# Patient Record
Sex: Female | Born: 1970 | Race: White | Hispanic: No | Smoking: Current every day smoker
Health system: Southern US, Community
[De-identification: ages and names within clinical notes are randomized; demographics above are authoritative.]

---

## 2010-05-29 ENCOUNTER — Encounter: Admission: RE | Admit: 2010-05-29 | Discharge: 2010-05-29 | Payer: Self-pay | Admitting: Obstetrics and Gynecology

## 2010-09-20 ENCOUNTER — Encounter: Payer: Self-pay | Admitting: Obstetrics and Gynecology

## 2011-03-24 ENCOUNTER — Other Ambulatory Visit (HOSPITAL_COMMUNITY): Payer: Self-pay | Admitting: Obstetrics & Gynecology

## 2011-04-14 ENCOUNTER — Other Ambulatory Visit (HOSPITAL_COMMUNITY): Payer: Self-pay | Admitting: Obstetrics & Gynecology

## 2011-04-14 ENCOUNTER — Other Ambulatory Visit: Payer: Self-pay | Admitting: Obstetrics and Gynecology

## 2011-04-16 ENCOUNTER — Other Ambulatory Visit: Payer: Self-pay | Admitting: Obstetrics and Gynecology

## 2011-04-19 ENCOUNTER — Other Ambulatory Visit: Payer: Self-pay | Admitting: Obstetrics and Gynecology

## 2011-08-26 ENCOUNTER — Other Ambulatory Visit: Payer: Self-pay | Admitting: Obstetrics and Gynecology

## 2016-04-05 ENCOUNTER — Other Ambulatory Visit: Payer: Self-pay | Admitting: Obstetrics and Gynecology

## 2016-04-05 DIAGNOSIS — R928 Other abnormal and inconclusive findings on diagnostic imaging of breast: Secondary | ICD-10-CM

## 2016-04-07 ENCOUNTER — Other Ambulatory Visit: Payer: Self-pay

## 2016-04-07 ENCOUNTER — Ambulatory Visit
Admission: RE | Admit: 2016-04-07 | Discharge: 2016-04-07 | Disposition: A | Discharge status: Home / Self Care | Payer: Managed Care, Other (non HMO) | Source: Ambulatory Visit | Attending: Obstetrics and Gynecology | Admitting: Obstetrics and Gynecology

## 2016-04-07 DIAGNOSIS — R928 Other abnormal and inconclusive findings on diagnostic imaging of breast: Secondary | ICD-10-CM

## 2016-12-16 IMAGING — MG 2D DIGITAL DIAGNOSTIC UNILATERAL LEFT MAMMOGRAM WITH CAD AND ADJ
6 series · 6 of 14 positions shown · non-contrast
Comparison: Previous exam(s).

CLINICAL DATA: Recall from screening mammography.

EXAM:
2D DIGITAL DIAGNOSTIC UNILATERAL LEFT MAMMOGRAM WITH CAD AND ADJUNCT
TOMO

[L MLO synth-2D]
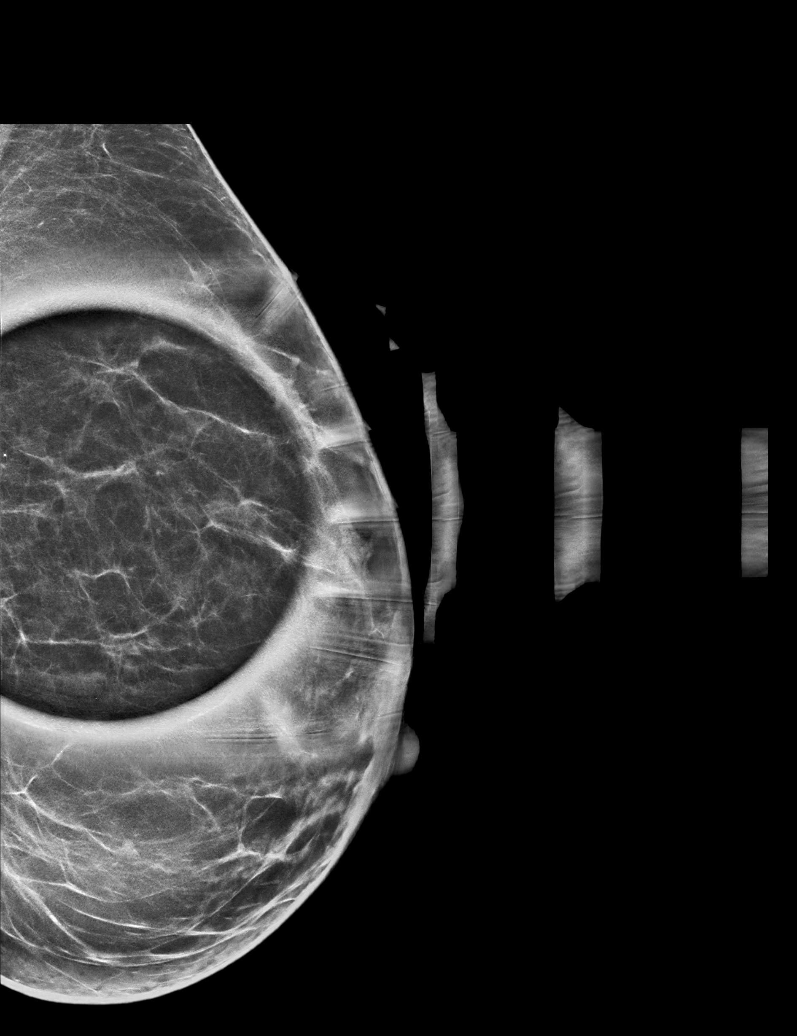

[L MLO]
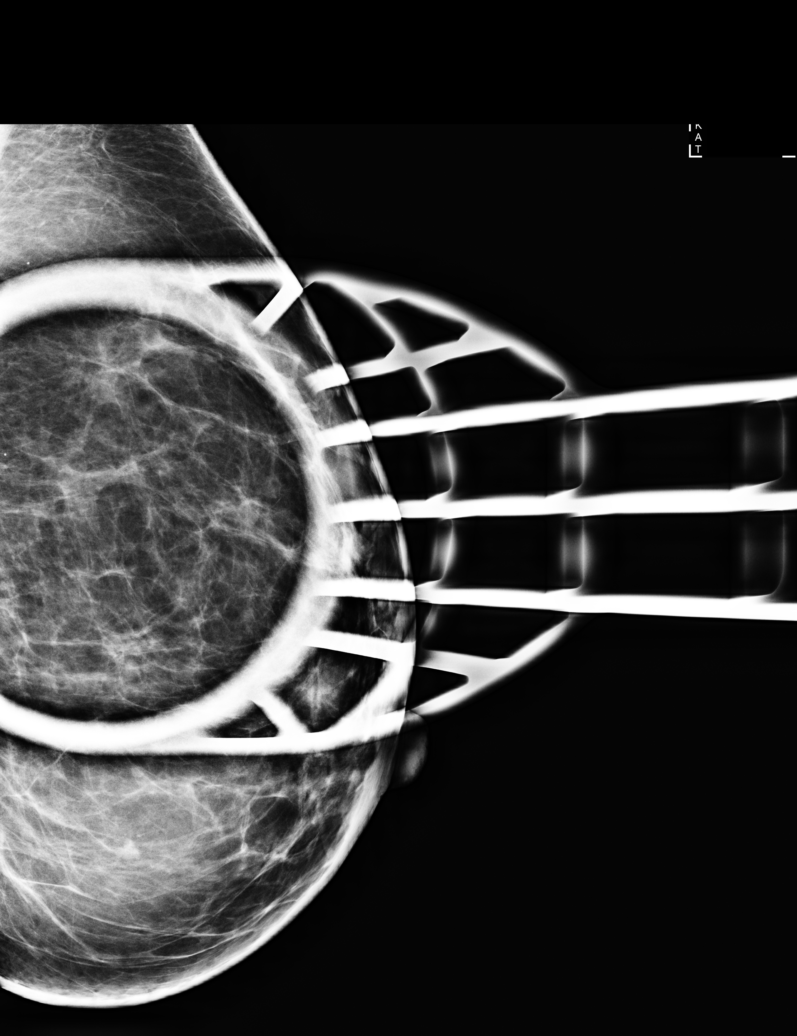

[L ML]
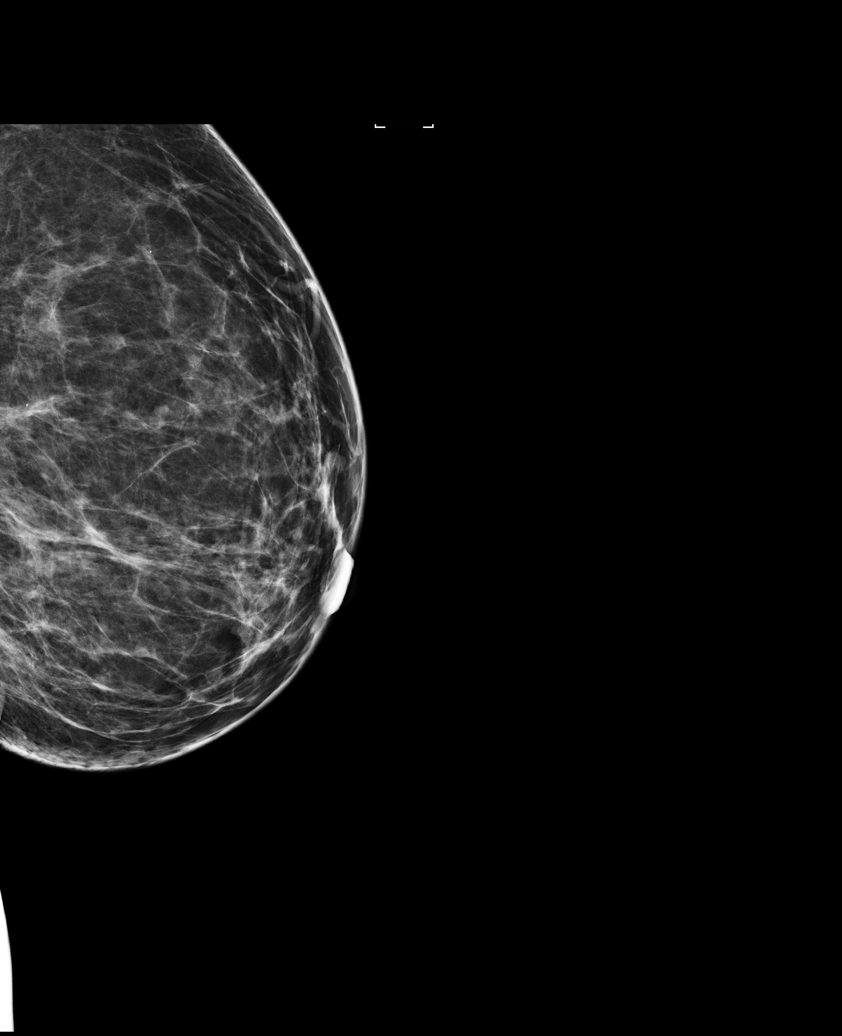

[L ML synth-2D]
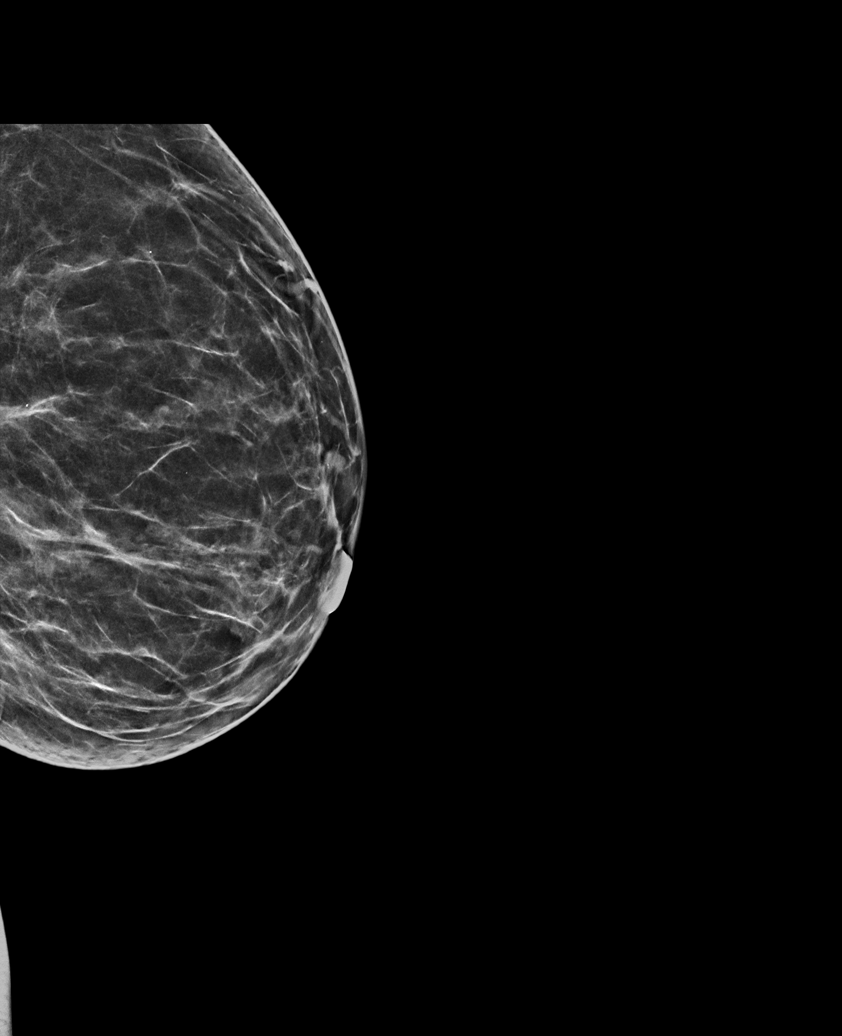

[L MLO tomo · tomo slice 33/64.0]
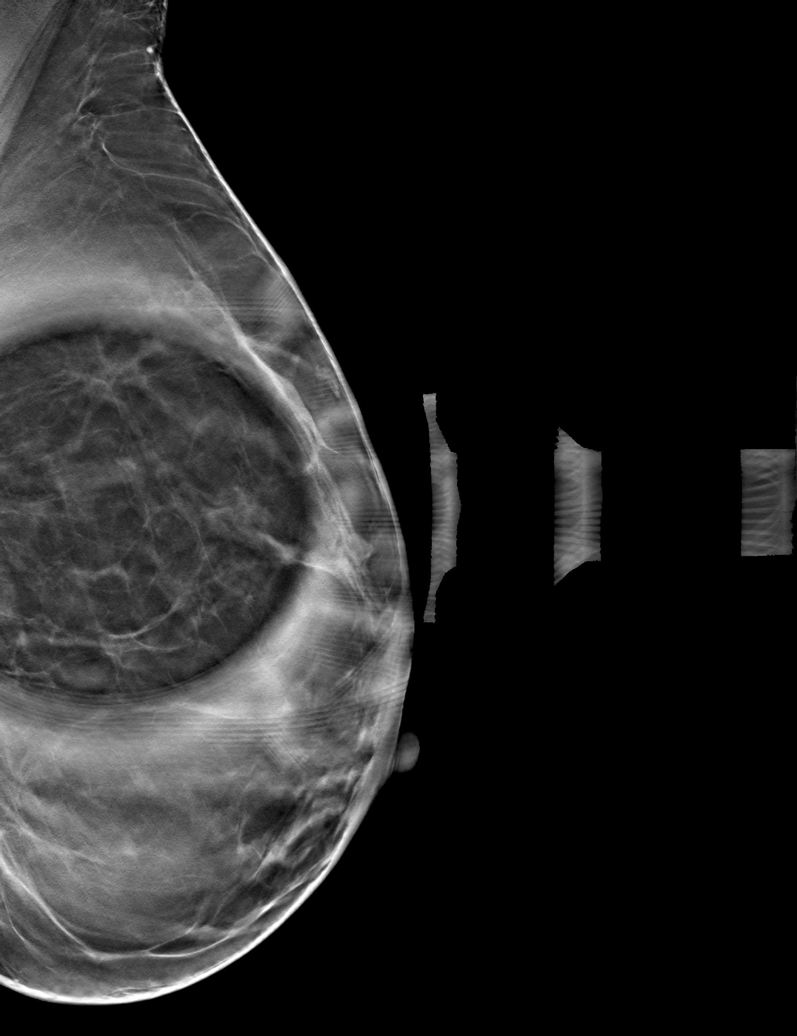

[L ML tomo · tomo slice 34/67.0]
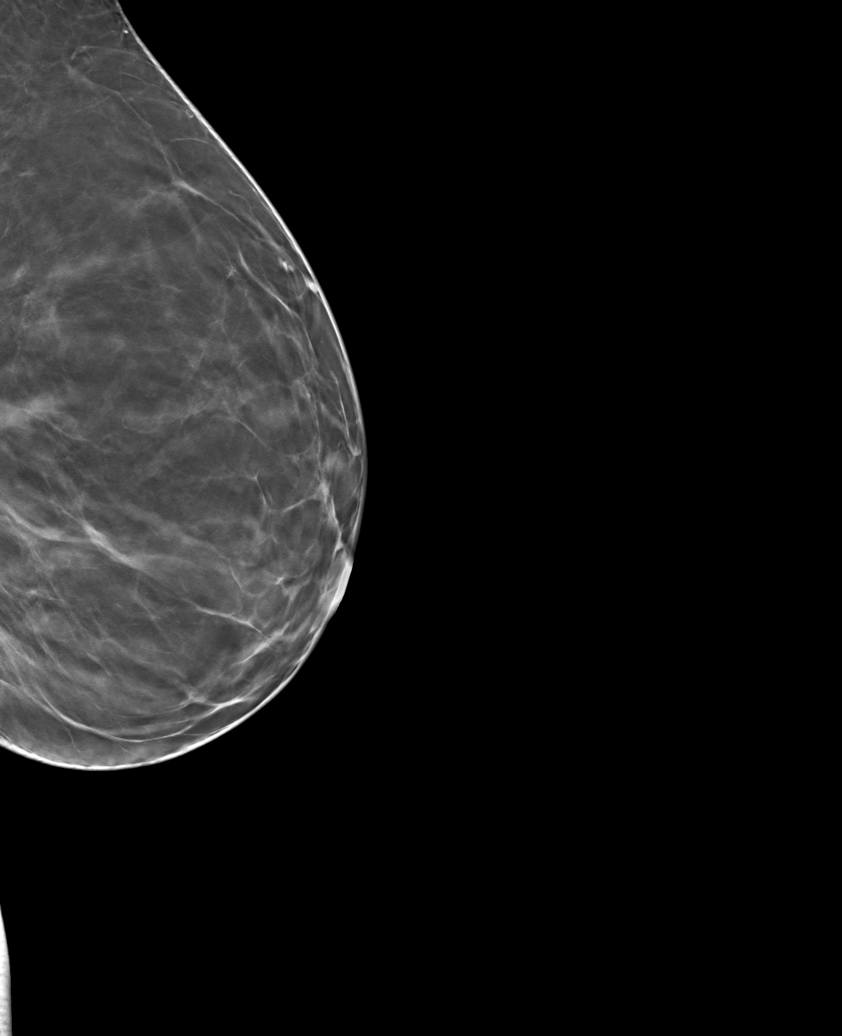

[6 of 14 positions shown; findings below may reference images not displayed]

ACR Breast Density Category b: There are scattered areas of
fibroglandular density.
FINDINGS: Additional views of the left breast with tomosynthesis demonstrate
no persistent abnormality. The appearance noted on the screening
study is consistent with a summation shadow.

Mammographic images were processed with CAD.
IMPRESSION: No persistent worrisome finding on additional evaluation the left
breast.

RECOMMENDATION:
Screening mammography in 1 year.

I have discussed the findings and recommendations with the patient.
Results were also provided in writing at the conclusion of the
visit. If applicable, a reminder letter will be sent to the patient
regarding the next appointment.

BI-RADS CATEGORY  1: Negative.

## 2019-07-11 ENCOUNTER — Ambulatory Visit (INDEPENDENT_AMBULATORY_CARE_PROVIDER_SITE_OTHER): Payer: BC Managed Care – PPO | Admitting: Podiatry

## 2019-07-11 ENCOUNTER — Encounter: Payer: Self-pay | Admitting: Podiatry

## 2019-07-11 ENCOUNTER — Other Ambulatory Visit: Payer: Self-pay

## 2019-07-11 ENCOUNTER — Ambulatory Visit (INDEPENDENT_AMBULATORY_CARE_PROVIDER_SITE_OTHER): Payer: BC Managed Care – PPO

## 2019-07-11 VITALS — BP 102/70 | HR 72 | Resp 16

## 2019-07-11 DIAGNOSIS — M7661 Achilles tendinitis, right leg: Secondary | ICD-10-CM | POA: Diagnosis not present

## 2019-07-11 DIAGNOSIS — M722 Plantar fascial fibromatosis: Secondary | ICD-10-CM

## 2019-07-11 DIAGNOSIS — M2042 Other hammer toe(s) (acquired), left foot: Secondary | ICD-10-CM

## 2019-07-11 NOTE — Progress Notes (Signed)
Subjective:   Patient ID: Vickie Espinoza, female   DOB: 48 y.o.   MRN: [id number]   HPI Patient presents stating I am having a lot of pain in the back of my right heel and on my left foot I have this chronic lesion between the fourth and fifth toes that gets sore.  Patient states that for the right she is tried shoe gear modifications and she uses medication and padding for the left which only helped temporarily.  Patient smokes periodically and likes to be active   Review of Systems  All other systems reviewed and are negative.       Objective:  Physical Exam Vitals signs and nursing note reviewed.  Constitutional:      Appearance: She is well-developed.  Pulmonary:     Effort: Pulmonary effort is normal.  Musculoskeletal: Normal range of motion.  Skin:    General: Skin is warm.  Neurological:     Mental Status: She is alert.     Neurovascular status intact muscle strength found to be adequate range of motion within normal limits.  Patient is noted to have chronic keratotic lesion distal medial aspect digit 5 left and fourth digit with pressure between the 2 toes and is found to have pain in the posterior aspect of the right heel lateral side with inflammation noted.  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  Patient wants injection understanding risk and I did a careful lateral injection keep it away from the center and medial aspect of the tendon with 3 mg dexamethasone 5 mg Xylocaine and for the left I debrided the lesion applied padding and discussed arthroplasty exostectomy and patient wants to get this done we will  check schedule and will schedule surgery and be seen by me prior to the procedure  X-rays indicate there is a small posterior spur right heel and there is an abutment of the fifth toe against the fourth toe left with exostotic lesion noted

## 2019-07-11 NOTE — Patient Instructions (Signed)

## 2019-10-24 ENCOUNTER — Telehealth: Payer: Self-pay | Admitting: *Deleted

## 2019-10-24 NOTE — Telephone Encounter (Signed)
Left message informing pt she was last seen 07/11/2019, and if Dr. Charlsie Merles felt an injection would be appropriate she would be able to receive.

## 2019-10-24 NOTE — Telephone Encounter (Signed)
Pt states she was seen in-office, x-rayed, and received an injection in the right ankle, and wanted to know if she would be able to get another injection.

## 2019-11-02 ENCOUNTER — Ambulatory Visit (INDEPENDENT_AMBULATORY_CARE_PROVIDER_SITE_OTHER): Payer: BC Managed Care – PPO | Admitting: Podiatry

## 2019-11-02 ENCOUNTER — Encounter: Payer: Self-pay | Admitting: Podiatry

## 2019-11-02 ENCOUNTER — Other Ambulatory Visit: Payer: Self-pay

## 2019-11-02 DIAGNOSIS — M722 Plantar fascial fibromatosis: Secondary | ICD-10-CM | POA: Diagnosis not present

## 2019-11-02 DIAGNOSIS — M7661 Achilles tendinitis, right leg: Secondary | ICD-10-CM | POA: Diagnosis not present

## 2019-11-02 NOTE — Progress Notes (Signed)
Subjective:   Patient ID: Vickie Espinoza, female   DOB: 49 y.o.   MRN: [id number]   HPI Patient states she has some discomfort in the posterior heel right but seems to have quite a bit of discomfort in the plantar heel right and is wondering which of the 2 problems seems to be the big problem that she is experiencing and states she is getting ready to go to [place] to hike and to be outdoors   ROS      Objective:  Physical Exam  Neurovascular status intact with plantar fascial right inflamed at the insertion to the calcaneus and posterior pain of a mild to moderate nature     Assessment:  Acute plantar fasciitis right with inflammation and posterior Achilles tendinitis present but improved     Plan:  H&P reviewed both conditions separately and for the posterior heel I recommended stretching exercises ice and anti-inflammatories and for plantar I did sterile prep and injected the fascia 3 mg Kenalog 5 mg Xylocaine and advised on supportive shoes.  Reappoint for [place] to recheck

## 2020-01-11 ENCOUNTER — Ambulatory Visit (INDEPENDENT_AMBULATORY_CARE_PROVIDER_SITE_OTHER): Payer: BC Managed Care – PPO | Admitting: Podiatry

## 2020-01-11 ENCOUNTER — Other Ambulatory Visit: Payer: Self-pay

## 2020-01-11 ENCOUNTER — Encounter: Payer: Self-pay | Admitting: Podiatry

## 2020-01-11 DIAGNOSIS — M7661 Achilles tendinitis, right leg: Secondary | ICD-10-CM

## 2020-01-11 DIAGNOSIS — M722 Plantar fascial fibromatosis: Secondary | ICD-10-CM

## 2020-01-11 MED ORDER — DICLOFENAC SODIUM 75 MG PO TBEC: 75.0000 mg | DELAYED_RELEASE_TABLET | Freq: Two times a day (BID) | ORAL | 2 refills | Status: AC

## 2020-01-14 NOTE — Progress Notes (Signed)
Subjective:   Patient ID: Vickie Espinoza, female   DOB: 49 y.o.   MRN: [id number]   HPI Patient presents stating that the bottom seems to be doing well but the back of the heel has flared back up again and is making walking difficult.  Patient states that she is not having any trouble as far as strength goes but discomfort with shoe gear   ROS      Objective:  Physical Exam  Neurovascular status intact with patient's posterior lateral heel being inflamed and sore with no increased fluid accumulation just discomfort.  Center and medial side not involved currently and also mild to moderate discomfort in the plantar heel into the mid arch and lateral foot     Assessment:  Inflammatory process with inflammation pain of the posterior lateral aspect of the heel and the plantar fashion into the mid arch     Plan:  H&P reviewed the conditions and I do still think we can get this better conservatively but due to the discomfort I recommended immobilization.  At this point I went ahead and I did do injection after explaining to her again always the risk of rupture associated with injections and I carefully injected into the lateral side keeping it away from central medial side 3 mg dexamethasone 5 mg Xylocaine and went ahead and applied air fracture walker to completely immobilize and will wear this for 3 weeks.  Reappoint to reevaluate

## 2020-02-08 ENCOUNTER — Ambulatory Visit: Payer: BC Managed Care – PPO | Admitting: Podiatry
